# Patient Record
Sex: Male | Born: 1985 | Race: White | Hispanic: Yes | Marital: Married
Health system: Southern US, Community
[De-identification: ages and names within clinical notes are randomized; demographics above are authoritative.]

---

## 2020-07-06 ENCOUNTER — Other Ambulatory Visit: Payer: Self-pay

## 2020-07-06 ENCOUNTER — Emergency Department (HOSPITAL_COMMUNITY): Payer: Self-pay

## 2020-07-06 ENCOUNTER — Emergency Department (HOSPITAL_COMMUNITY)
Admission: EM | Admit: 2020-07-06 | Discharge: 2020-07-07 | Disposition: A | Discharge status: Home / Self Care | Payer: Self-pay | Attending: Emergency Medicine | Admitting: Emergency Medicine

## 2020-07-06 ENCOUNTER — Encounter (HOSPITAL_COMMUNITY): Payer: Self-pay

## 2020-07-06 DIAGNOSIS — R42 Dizziness and giddiness: Secondary | ICD-10-CM | POA: Insufficient documentation

## 2020-07-06 DIAGNOSIS — Z20822 Contact with and (suspected) exposure to covid-19: Secondary | ICD-10-CM | POA: Insufficient documentation

## 2020-07-06 DIAGNOSIS — R519 Headache, unspecified: Secondary | ICD-10-CM | POA: Insufficient documentation

## 2020-07-06 DIAGNOSIS — R1013 Epigastric pain: Secondary | ICD-10-CM | POA: Insufficient documentation

## 2020-07-06 LAB — COMPREHENSIVE METABOLIC PANEL
ALT: 24 U/L (ref 0–44)
AST: 21 U/L (ref 15–41)
Albumin: 4.7 g/dL (ref 3.5–5.0)
Alkaline Phosphatase: 59 U/L (ref 38–126)
Anion gap: 10 (ref 5–15)
BUN: 15 mg/dL (ref 6–20)
CO2: 24 mmol/L (ref 22–32)
Calcium: 9.4 mg/dL (ref 8.9–10.3)
Chloride: 102 mmol/L (ref 98–111)
Creatinine, Ser: 0.91 mg/dL (ref 0.61–1.24)
GFR calc Af Amer: >60 mL/min (ref >60)
GFR calc non Af Amer: >60 mL/min (ref >60)
Glucose, Bld: 110 mg/dL — ABNORMAL HIGH (ref 70–99)
Potassium: 3.6 mmol/L (ref 3.5–5.1)
Sodium: 136 mmol/L (ref 135–145)
Total Bilirubin: 0.8 mg/dL (ref 0.3–1.2)
Total Protein: 7.4 g/dL (ref 6.5–8.1)

## 2020-07-06 LAB — URINALYSIS, ROUTINE W REFLEX MICROSCOPIC
Bacteria, UA: NONE SEEN
Bilirubin Urine: NEGATIVE
Glucose, UA: NEGATIVE mg/dL
Ketones, ur: NEGATIVE mg/dL
Leukocytes,Ua: NEGATIVE
Nitrite: NEGATIVE
Protein, ur: NEGATIVE mg/dL
Specific Gravity, Urine: 1.004 — ABNORMAL LOW (ref 1.005–1.030)
pH: 6 (ref 5.0–8.0)

## 2020-07-06 LAB — CBC
HCT: 45.1 % (ref 39.0–52.0)
Hemoglobin: 15.2 g/dL (ref 13.0–17.0)
MCH: 30.6 pg (ref 26.0–34.0)
MCHC: 33.7 g/dL (ref 30.0–36.0)
MCV: 90.7 fL (ref 80.0–100.0)
Platelets: 288 10*3/uL (ref 150–400)
RBC: 4.97 MIL/uL (ref 4.22–5.81)
RDW: 12.2 % (ref 11.5–15.5)
WBC: 8.5 10*3/uL (ref 4.0–10.5)
nRBC: 0 % (ref 0.0–0.2)

## 2020-07-06 LAB — LIPASE, BLOOD: Lipase: 35 U/L (ref 11–51)

## 2020-07-06 NOTE — ED Triage Notes (Signed)
Pt comes for lower abd pain and SOB that has been going on for 4 days, seen at [facility] and sent here for further work up. Pt also having dizziness and dark stools.

## 2020-07-07 LAB — SARS CORONAVIRUS 2 BY RT PCR (HOSPITAL ORDER, PERFORMED IN ~~LOC~~ HOSPITAL LAB): SARS Coronavirus 2: NEGATIVE

## 2020-07-07 NOTE — ED Provider Notes (Signed)
[facility] EMERGENCY DEPARTMENT Provider Note   CSN: [id number] Arrival date & time: 07/06/20  1915     History Chief Complaint  Patient presents with  . Abdominal Pain    Kevin Werner is a 34 y.o. male.  34 yo M with a chief complaint of epigastric abdominal pain.  Is been going on for the past 4 days.  Worse with overeating.  Feels that it goes into his back and also causes him to have a headache.  Denies vomiting.  Denies fever.  Denies diarrhea.  Denies cough or congestion.  Denies sick contacts.  The history is provided by the patient.  Abdominal Pain Pain location:  Epigastric Pain quality: aching and bloating   Pain radiates to:  Does not radiate Pain severity:  Mild Onset quality:  Gradual Duration:  4 days Timing:  Constant Progression:  Worsening Chronicity:  New Relieved by:  Nothing Worsened by:  Nothing Ineffective treatments:  None tried Associated symptoms: no chest pain, no chills, no diarrhea, no fever, no shortness of breath and no vomiting        History reviewed. No pertinent past medical history.  There are no problems to display for this patient.   History reviewed. No pertinent surgical history.     No family history on file.  Social History   Tobacco Use  . Smoking status: Not on file  Substance Use Topics  . Alcohol use: Not on file  . Drug use: Not on file    Home Medications Prior to Admission medications   Not on File    Allergies    Patient has no known allergies.  Review of Systems   Review of Systems  Constitutional: Negative for chills and fever.  HENT: Negative for congestion and facial swelling.   Eyes: Negative for discharge and visual disturbance.  Respiratory: Negative for shortness of breath.   Cardiovascular: Negative for chest pain and palpitations.  Gastrointestinal: Positive for abdominal pain. Negative for diarrhea and vomiting.  Musculoskeletal: Negative for arthralgias and  myalgias.  Skin: Negative for color change and rash.  Neurological: Positive for dizziness and headaches. Negative for tremors and syncope.  Psychiatric/Behavioral: Negative for confusion and dysphoric mood.    Physical Exam Updated Vital Signs BP 119/84 (BP Location: Left Arm)   Pulse 79   Temp (!) 97.5 F (36.4 C) (Oral)   Resp 16   SpO2 100%   Physical Exam Vitals and nursing note reviewed.  Constitutional:      Appearance: He is well-developed.  HENT:     Head: Normocephalic and atraumatic.  Eyes:     Pupils: Pupils are equal, round, and reactive to light.  Neck:     Vascular: No JVD.  Cardiovascular:     Rate and Rhythm: Normal rate and regular rhythm.     Heart sounds: No murmur heard.  No friction rub. No gallop.   Pulmonary:     Effort: No respiratory distress.     Breath sounds: No wheezing.  Abdominal:     General: There is no distension.     Tenderness: There is no abdominal tenderness. There is no guarding or rebound.     Comments: Benign abdominal exam  Musculoskeletal:        General: Normal range of motion.     Cervical back: Normal range of motion and neck supple.  Skin:    Coloration: Skin is not pale.     Findings: No rash.  Neurological:  Mental Status: He is alert and oriented to person, place, and time.  Psychiatric:        Behavior: Behavior normal.     ED Results / Procedures / Treatments   Labs (all labs ordered are listed, but only abnormal results are displayed) Labs Reviewed  COMPREHENSIVE METABOLIC PANEL - Abnormal; Notable for the following components:      Result Value   Glucose, Bld 110 (*)    All other components within normal limits  URINALYSIS, ROUTINE W REFLEX MICROSCOPIC - Abnormal; Notable for the following components:   Color, Urine COLORLESS (*)    Specific Gravity, Urine 1.004 (*)    Hgb urine dipstick SMALL (*)    All other components within normal limits  SARS CORONAVIRUS 2 BY RT PCR (HOSPITAL ORDER, PERFORMED  IN [facility] LAB)  LIPASE, BLOOD  CBC    EKG EKG Interpretation  Date/Time:  Wednesday July 06 2020 20:02:33 EDT Ventricular Rate:  94 PR Interval:  158 QRS Duration: 92 QT Interval:  354 QTC Calculation: 442 R Axis:   109 Text Interpretation: Normal sinus rhythm Possible Right ventricular hypertrophy Inferior infarct , age undetermined Cannot rule out Anterior infarct , age undetermined Abnormal ECG No old tracing to compare Confirmed by Melene Plan [phone]) on 07/07/2020 9:55:29 AM   Radiology DG Chest 2 View  Result Date: 07/06/2020 CLINICAL DATA:  Shortness of breath fever EXAM: CHEST - 2 VIEW COMPARISON:  None. FINDINGS: The heart size and mediastinal contours are within normal limits. Both lungs are clear. The visualized skeletal structures are unremarkable. IMPRESSION: No active cardiopulmonary disease. Electronically Signed   By: Jasmine Pang M.D.   On: 07/06/2020 20:23    Procedures Procedures (including critical care time)  Medications Ordered in ED Medications - No data to display  ED Course  I have reviewed the triage vital signs and the nursing notes.  Pertinent labs & imaging results that were available during my care of the patient were reviewed by me and considered in my medical decision making (see chart for details).    MDM Rules/Calculators/A&P                          33 yo M with a chief complaint of epigastric abdominal pain that is worse after eating a meal.  He has benign abdominal exam.  Lab work without LFT elevation lipase is normal.  No significant leukocytosis.  EKG without concerning finding.  Chest x-ray viewed by me without focal infiltrate pneumothorax.  I discussed the most likely diagnosis with the patient.  We will have him try Pepcid or Tagamet.  PCP follow-up.  Kevin Werner was evaluated in Emergency Department on 07/07/2020 for the symptoms described in the history of present illness. He/she was evaluated in the context  of the global COVID-19 pandemic, which necessitated consideration that the patient might be at risk for infection with the SARS-CoV-2 virus that causes COVID-19. Institutional protocols and algorithms that pertain to the evaluation of patients at risk for COVID-19 are in a state of rapid change based on information released by regulatory bodies including the CDC and federal and state organizations. These policies and algorithms were followed during the patient's care in the ED.   10:42 AM:  I have discussed the diagnosis/risks/treatment options with the patient and believe the pt to be eligible for discharge home to follow-up with PCP. We also discussed returning to the ED immediately if new or worsening  sx occur. We discussed the sx which are most concerning (e.g., sudden worsening pain, fever, inability to tolerate by mouth) that necessitate immediate return. Medications administered to the patient during their visit and any new prescriptions provided to the patient are listed below.  Medications given during this visit Medications - No data to display   The patient appears reasonably screen and/or stabilized for discharge and I doubt any other medical condition or other [facility] requiring further screening, evaluation, or treatment in the ED at this time prior to discharge.   Final Clinical Impression(s) / ED Diagnoses Final diagnoses:  Epigastric abdominal pain    Rx / DC Orders ED Discharge Orders    None       Melene Plan, DO 07/07/20 1043

## 2020-07-07 NOTE — Discharge Instructions (Signed)
Try pepcid or tagamet up to twice a day.  Try to avoid things that may make this worse, most commonly these are spicy foods tomato based products fatty foods chocolate and peppermint.  Alcohol and tobacco can also make this worse.  Return to the emergency department for sudden worsening pain fever or inability to eat or drink.   We are testing you for the coronavirus.  This takes a couple hours to come back.  If is positive someone should give you a call.  If your test is positive please self isolate.  The biggest issue with Covid is difficulty breathing if you have worsening trouble breathing please return to the ED.  Everyone needs a family doctor.  Have attached a hotline for you to call to try and establish with a family doctor.  Pruebe pepcid o tagamet [employer] al da. Trate de evitar cosas que puedan [employer] cosas, por lo general se trata de alimentos picantes, productos a base de tomate, alimentos grasos, chocolate y [place]. El alcohol y el tabaco tambin pueden empeorar esta situacin. Regrese al departamento de emergencias si el dolor empeora repentinamente, la fiebre o la incapacidad para comer o beber.  Lo estamos probando para el coronavirus. Esto tarda un par de Education officer, environmental. Si es positivo, alguien Secondary school teacher. Si su prueba es positiva, por favor aslese. El mayor problema con Covid es la dificultad para respirar. Si la dificultad para respirar empeora, regrese al servicio de urgencias.  Todo el mundo necesita un mdico de Information systems manager. He adjuntado una lnea directa a la que llamar y tratar de Environmental consultant con un mdico de cabecera.

## 2022-04-08 IMAGING — CR DG CHEST 2V
2 series · 2 of 2 positions shown · non-contrast
Comparison: None.

CLINICAL DATA: Shortness of breath fever

EXAM:
CHEST - 2 VIEW

[chest pa]
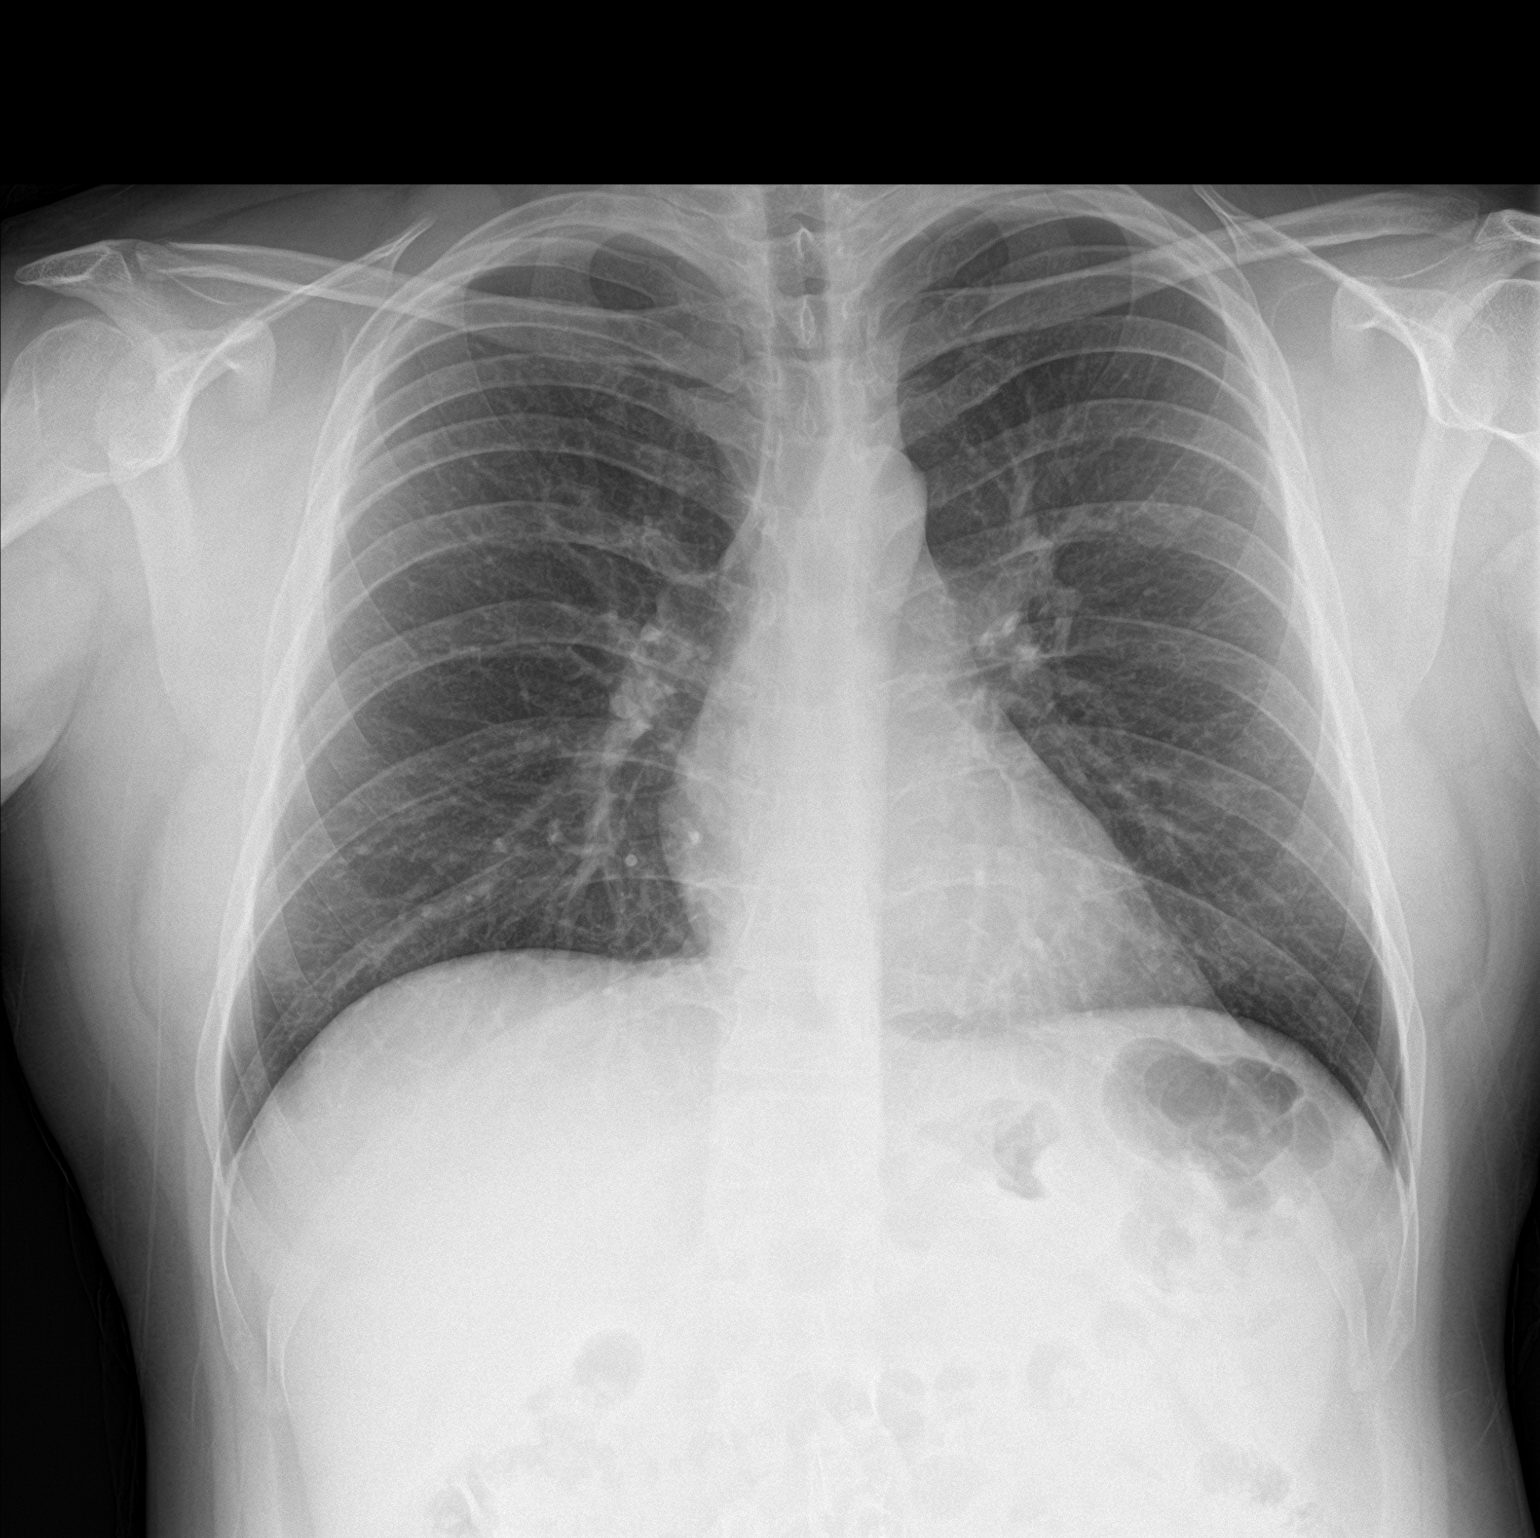

[chest lat]
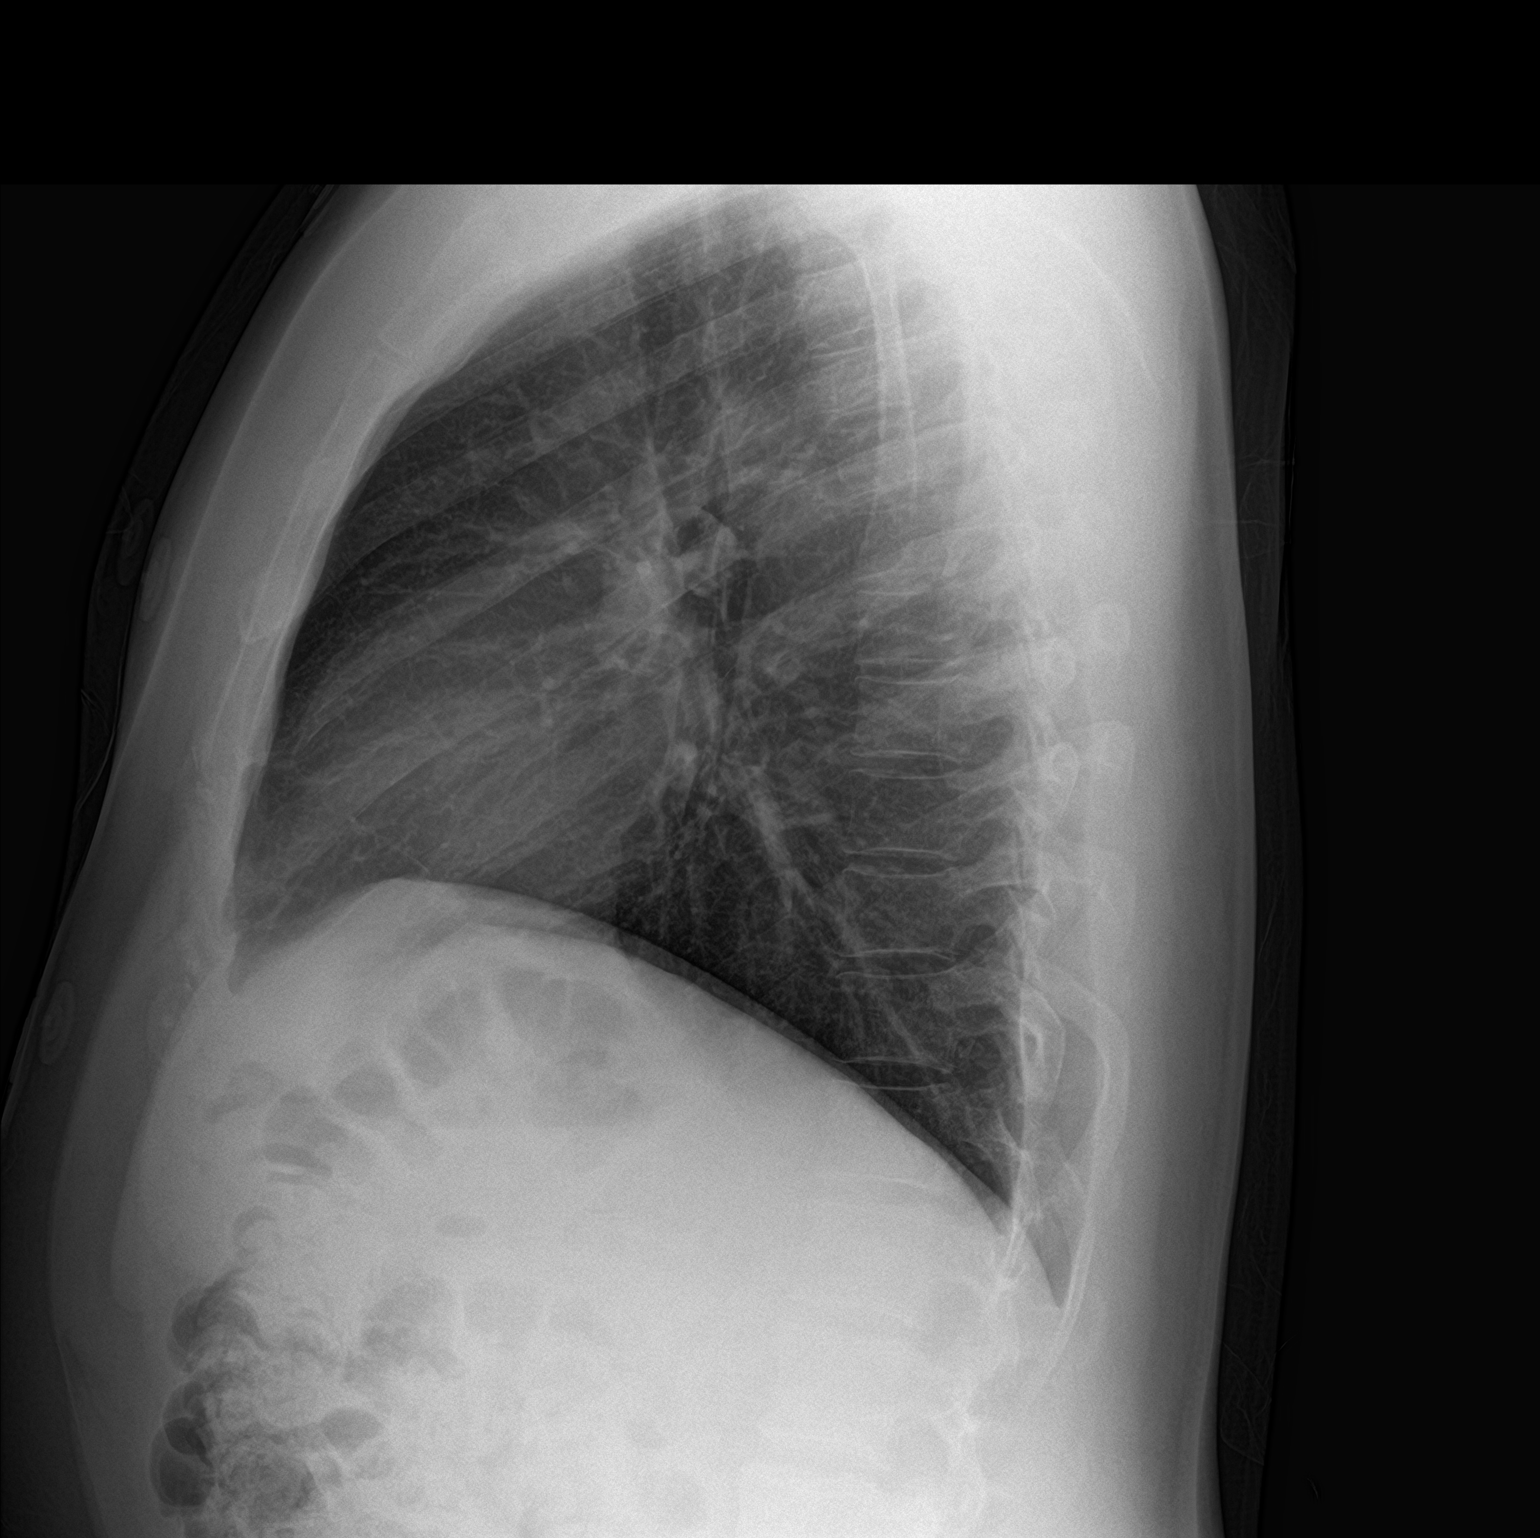

[2 of 2 positions shown; findings below may reference images not displayed]

FINDINGS: The heart size and mediastinal contours are within normal limits.
Both lungs are clear. The visualized skeletal structures are
unremarkable.
IMPRESSION: No active cardiopulmonary disease.
